# Patient Record
Sex: Female | Born: 1955 | Race: Black or African American | Hispanic: No | Smoking: Never smoker
Health system: Southern US, Community
[De-identification: ages and names within clinical notes are randomized; demographics above are authoritative.]

## PROBLEM LIST (undated history)

## (undated) DIAGNOSIS — G4733 Obstructive sleep apnea (adult) (pediatric): Secondary | ICD-10-CM

## (undated) DIAGNOSIS — K219 Gastro-esophageal reflux disease without esophagitis: Secondary | ICD-10-CM

## (undated) DIAGNOSIS — E785 Hyperlipidemia, unspecified: Secondary | ICD-10-CM

## (undated) DIAGNOSIS — I10 Essential (primary) hypertension: Secondary | ICD-10-CM

## (undated) DIAGNOSIS — G629 Polyneuropathy, unspecified: Secondary | ICD-10-CM

## (undated) DIAGNOSIS — E119 Type 2 diabetes mellitus without complications: Secondary | ICD-10-CM

## (undated) HISTORY — DX: Hyperlipidemia, unspecified: E78.5

## (undated) HISTORY — DX: Obstructive sleep apnea (adult) (pediatric): G47.33

## (undated) HISTORY — DX: Essential (primary) hypertension: I10

## (undated) HISTORY — PX: ABDOMINAL HYSTERECTOMY: SHX81

## (undated) HISTORY — DX: Type 2 diabetes mellitus without complications: E11.9

## (undated) HISTORY — DX: Polyneuropathy, unspecified: G62.9

## (undated) HISTORY — DX: Gastro-esophageal reflux disease without esophagitis: K21.9

---

## 2013-04-01 ENCOUNTER — Emergency Department: Payer: Self-pay | Admitting: Emergency Medicine

## 2014-11-20 IMAGING — CT CT HEAD WITHOUT CONTRAST
1 series · 16 of 29 positions shown, 20 images · non-contrast
Comparison: None.

CLINICAL DATA: Headache with intermittent left-sided facial
numbness

EXAM:
CT HEAD WITHOUT CONTRAST
TECHNIQUE: Contiguous axial images were obtained from the base of the skull
through the vertex without intravenous contrast. Study was obtained
within 24 hr of patient's arrival at the emergency department.

[Series 2: soft tissue · axial · 0.42mm/px · z∈[-78,+52]mm · 16 of 29 slices shown, 20 images]
[im 2/29  brain]
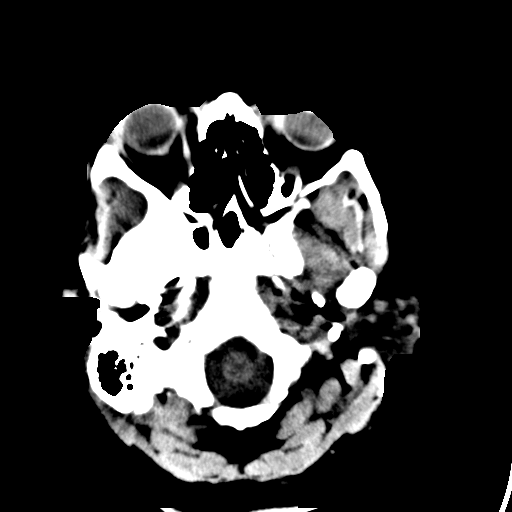
[im 2/29  bone]
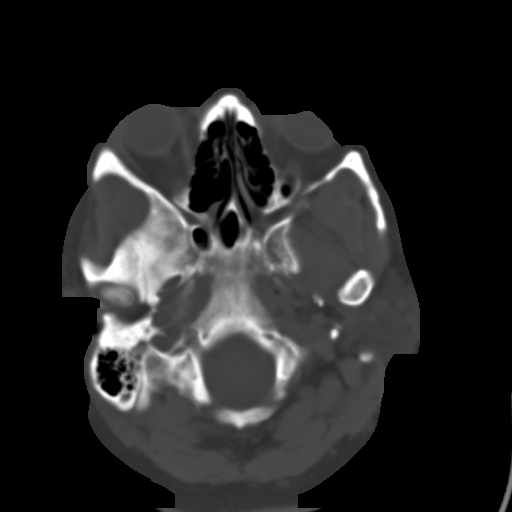
[im 4/29  brain]
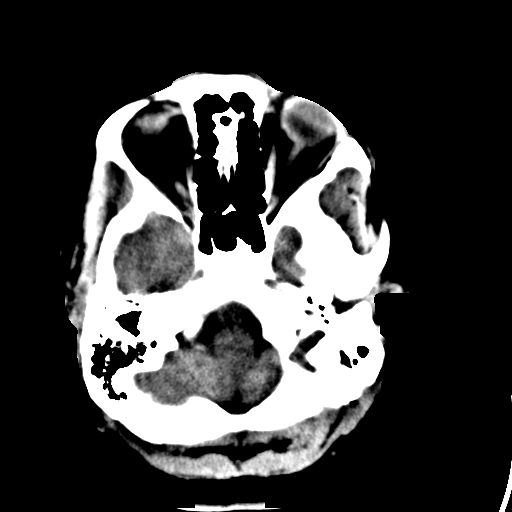
[im 6/29  brain]
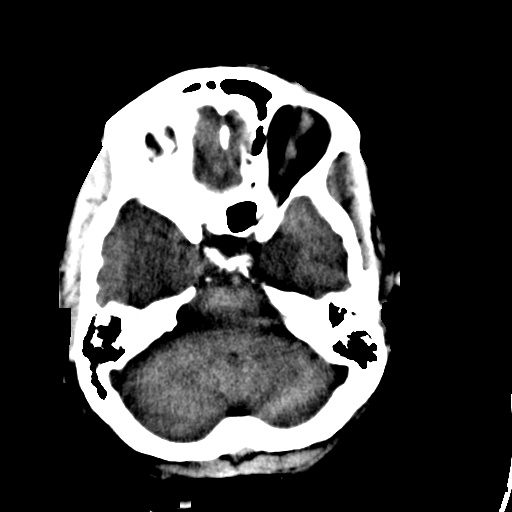
[im 7/29  brain]
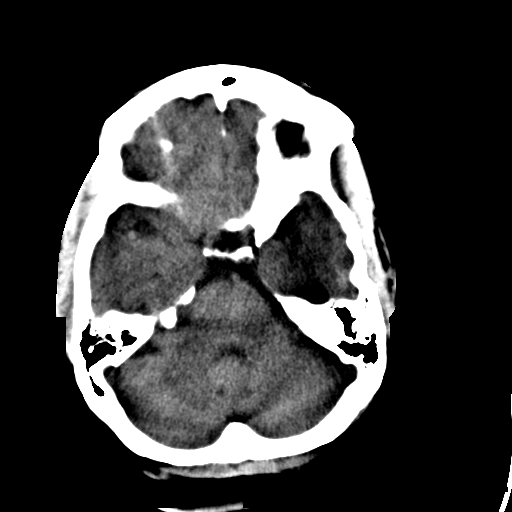
[im 9/29  brain]
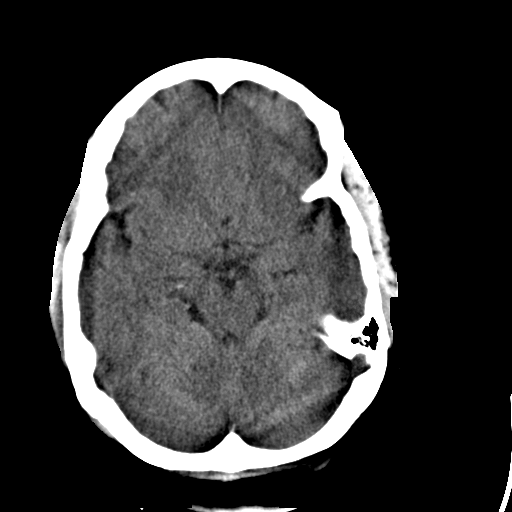
[im 9/29  bone]
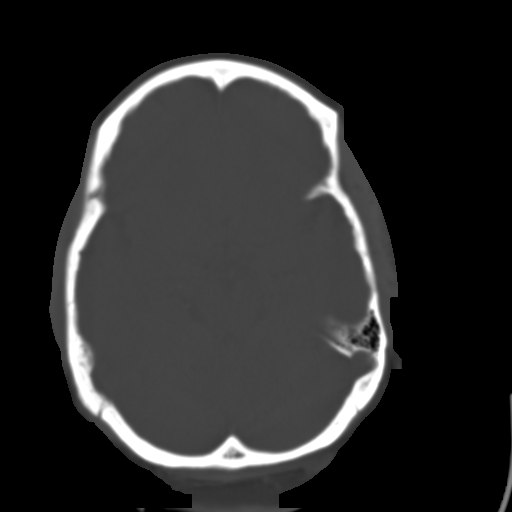
[im 11/29  brain]
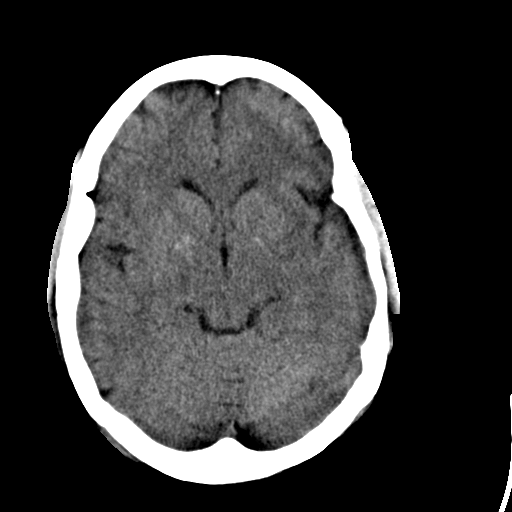
[im 12/29  brain]
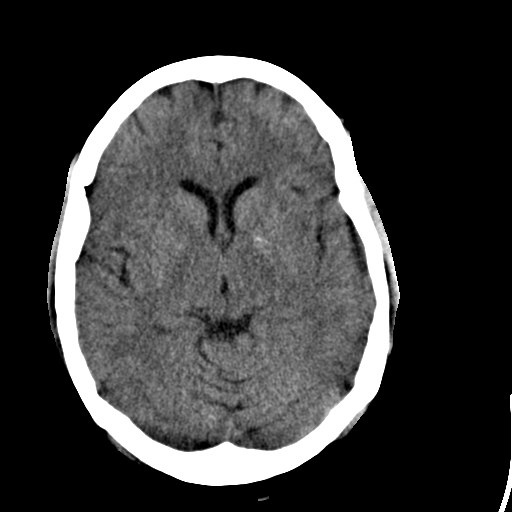
[im 14/29  brain]
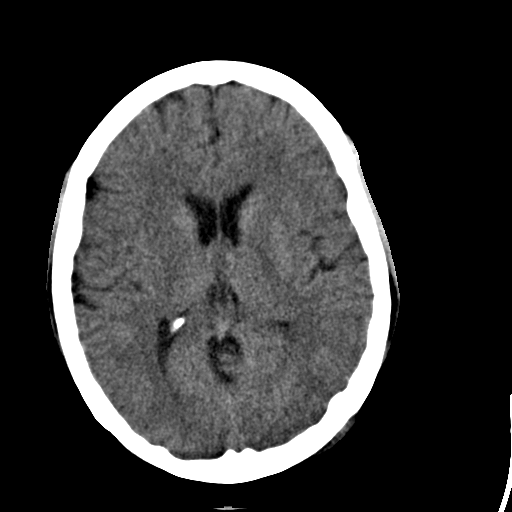
[im 16/29  brain]
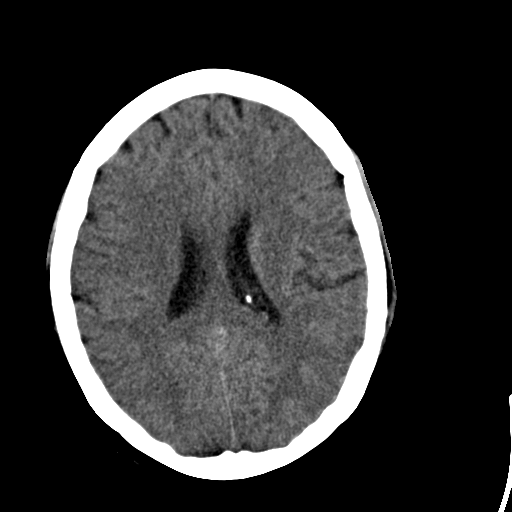
[im 16/29  bone]
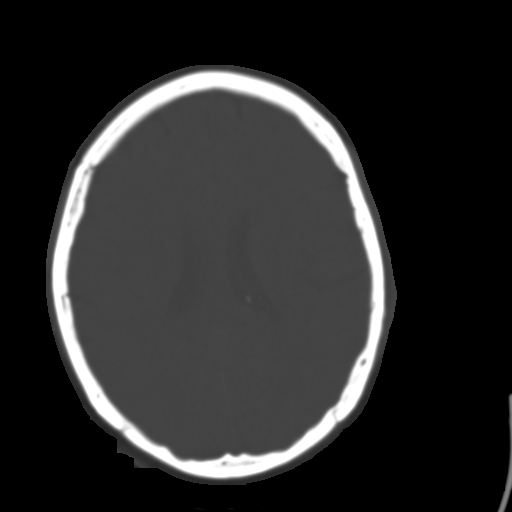
[im 18/29  brain]
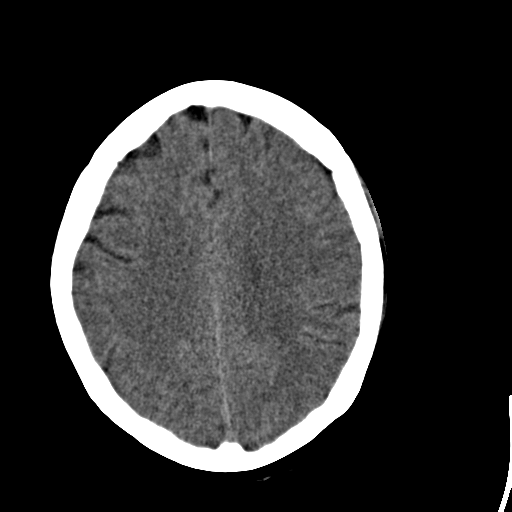
[im 19/29  brain]
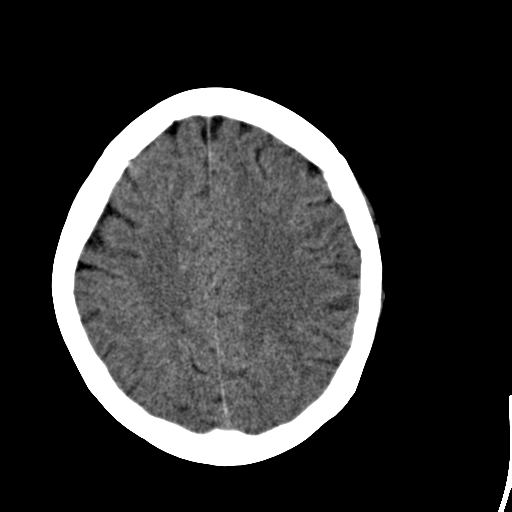
[im 21/29  brain]
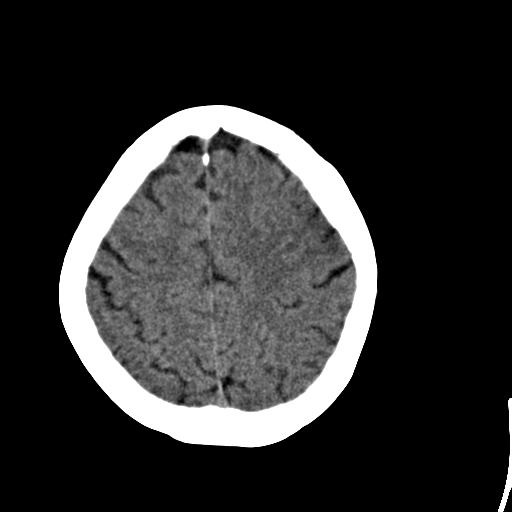
[im 23/29  brain]
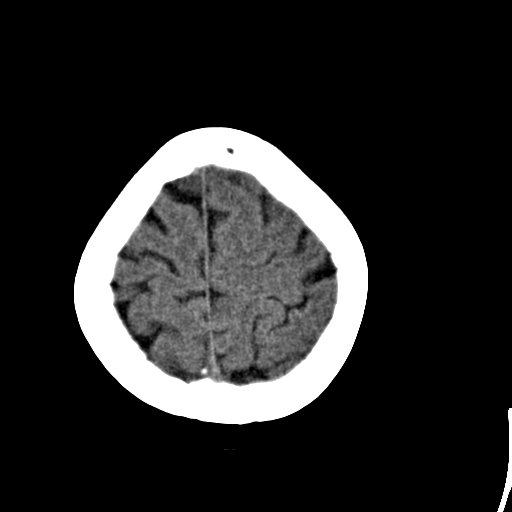
[im 23/29  bone]
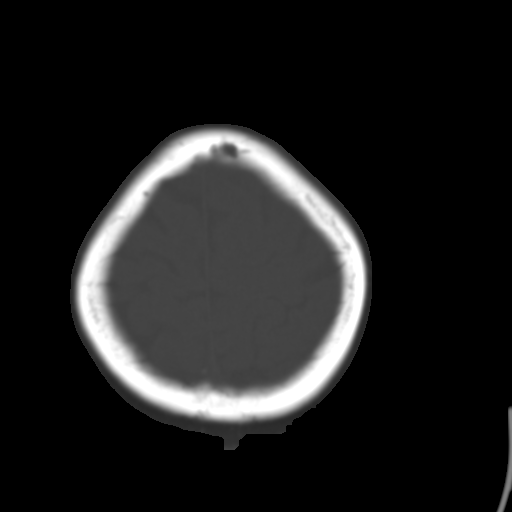
[im 24/29  brain]
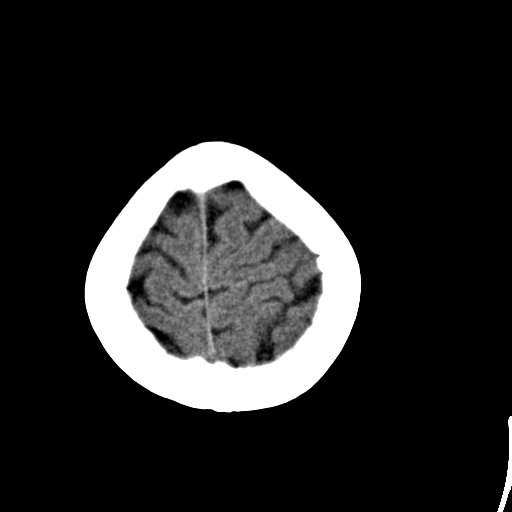
[im 26/29  brain]
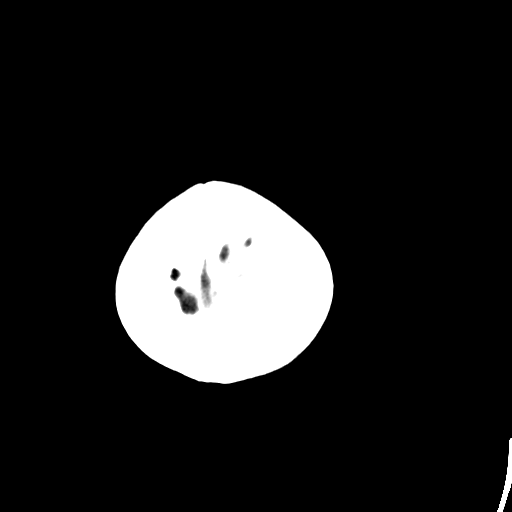
[im 28/29  brain]
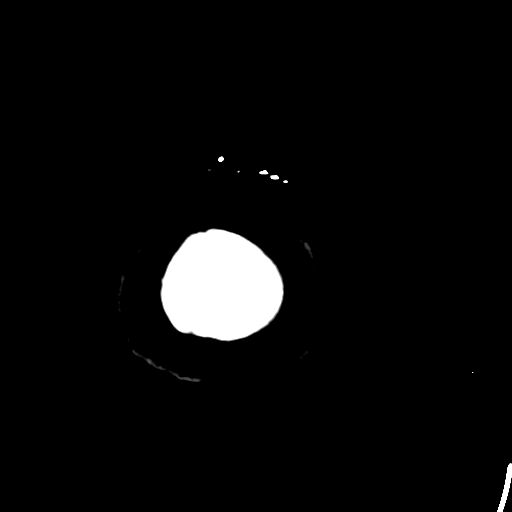

[16 of 29 positions shown; findings below may reference images not displayed]

FINDINGS: The ventricles are normal in size and configuration. There is no
mass, hemorrhage, extra-axial fluid collection, or midline shift.
Gray-white compartments appear normal. The no acute infarct
apparent. A small amount of basal ganglia calcification may be
physiologic in this age group. The bony calvarium appears intact.
The mastoid air cells are clear.
IMPRESSION: Study within normal limits.

## 2016-01-05 ENCOUNTER — Other Ambulatory Visit: Payer: Self-pay | Admitting: Internal Medicine

## 2016-01-05 DIAGNOSIS — M25561 Pain in right knee: Secondary | ICD-10-CM

## 2016-01-05 DIAGNOSIS — R609 Edema, unspecified: Secondary | ICD-10-CM

## 2016-01-12 ENCOUNTER — Ambulatory Visit
Admission: RE | Admit: 2016-01-12 | Discharge: 2016-01-12 | Disposition: A | Discharge status: Home / Self Care | Payer: Self-pay | Source: Ambulatory Visit | Attending: Internal Medicine | Admitting: Internal Medicine

## 2016-01-12 DIAGNOSIS — M25561 Pain in right knee: Secondary | ICD-10-CM

## 2016-01-12 DIAGNOSIS — R609 Edema, unspecified: Secondary | ICD-10-CM

## 2016-02-26 ENCOUNTER — Institutional Professional Consult (permissible substitution): Payer: BC Managed Care – PPO | Admitting: Neurology

## 2016-04-16 ENCOUNTER — Institutional Professional Consult (permissible substitution): Payer: BC Managed Care – PPO | Admitting: Neurology

## 2017-09-01 IMAGING — US US EXTREM LOW*R* LIMITED
1 series · 14 of 14 positions shown · non-contrast
Comparison: None.

CLINICAL DATA: Posterior right knee pain for 3 weeks.

EXAM:
ULTRASOUND RIGHT LOWER EXTREMITY LIMITED
TECHNIQUE: Ultrasound examination of the lower extremity soft tissues was
performed in the area of clinical concern.

[Series 1: us extrem low*right* limited · 0.09mm/px · 14 of 14 slices shown]
[im 1/14]
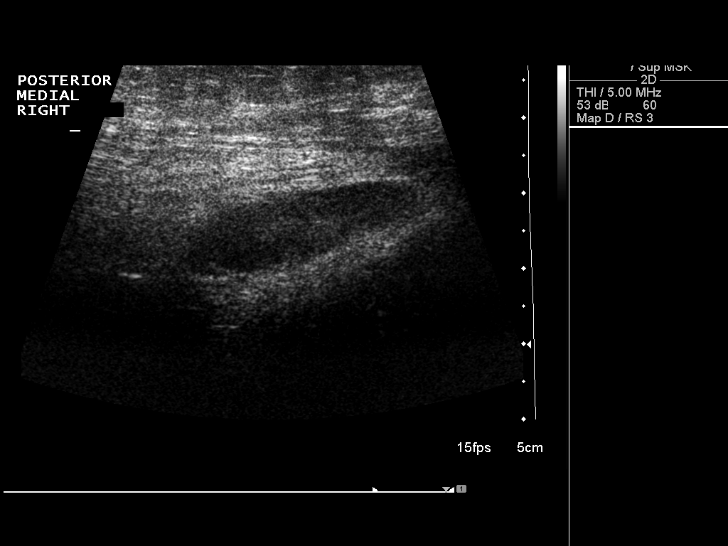
[im 2/14]
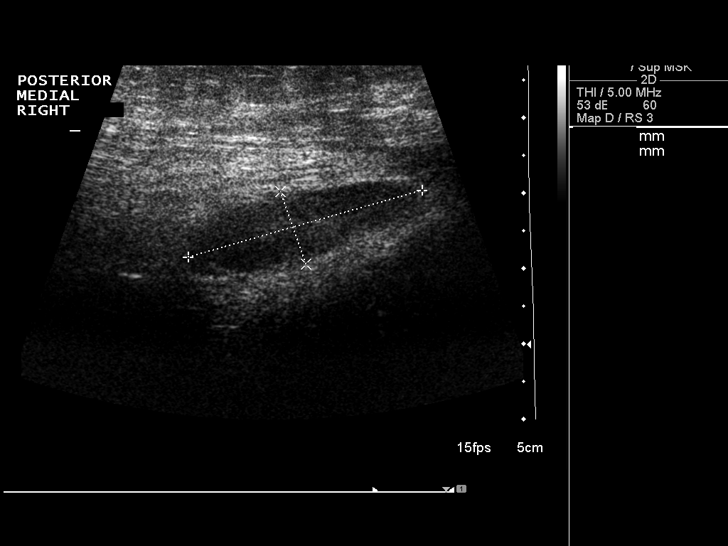
[im 3/14]
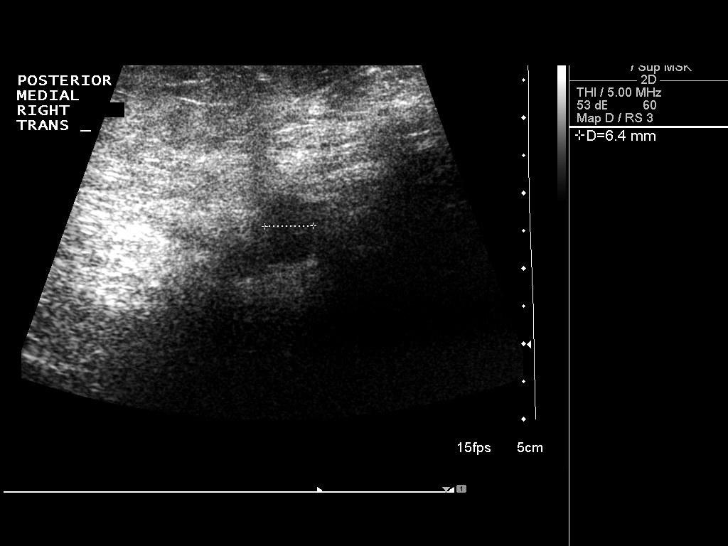
[im 4/14]
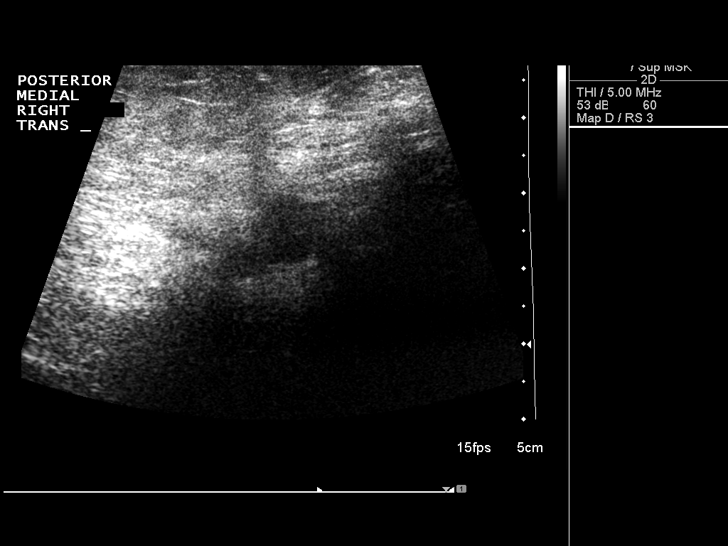
[im 5/14]
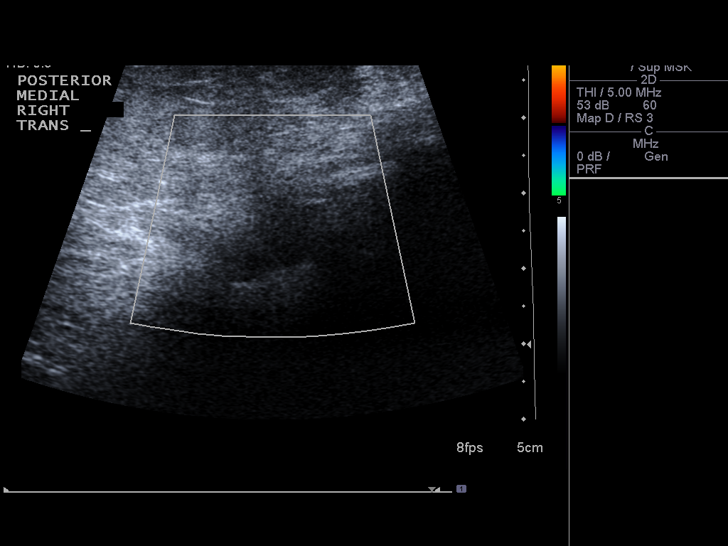
[im 6/14]
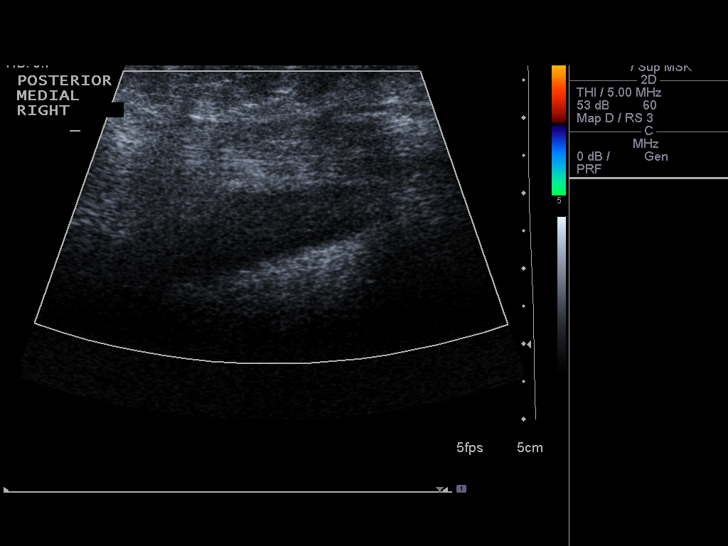
[im 7/14]
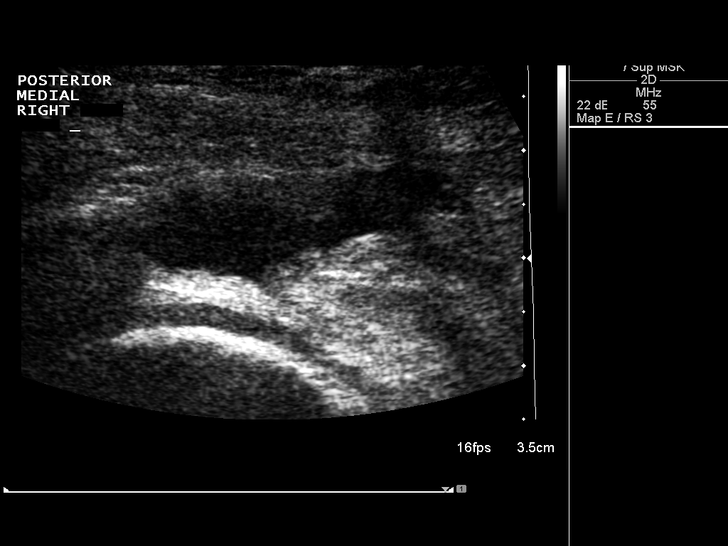
[im 8/14]
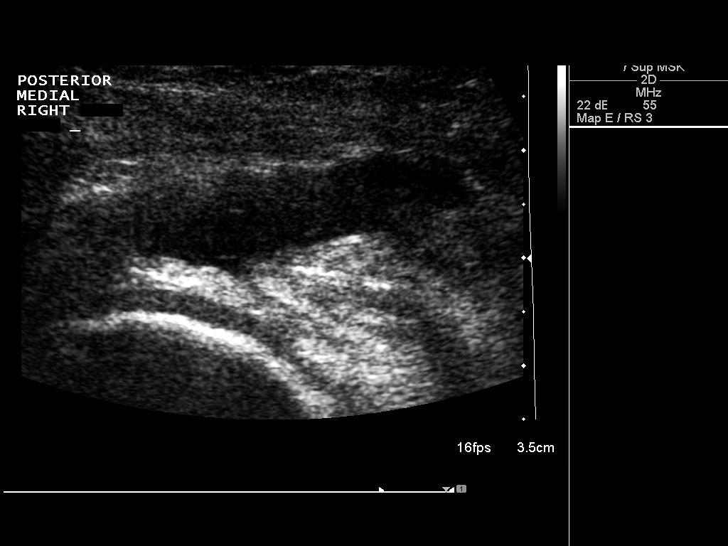
[im 9/14]
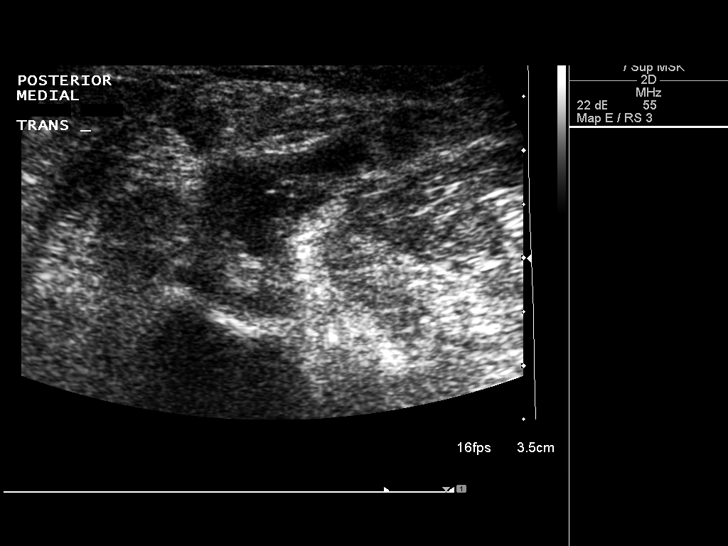
[im 10/14]
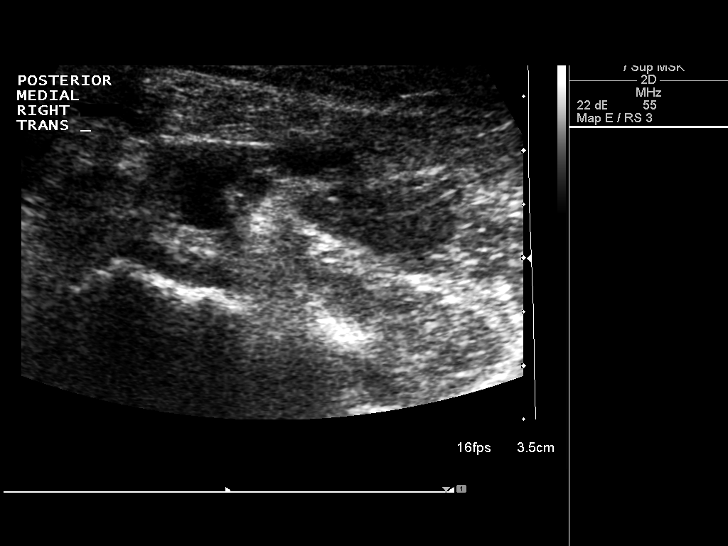
[im 11/14]
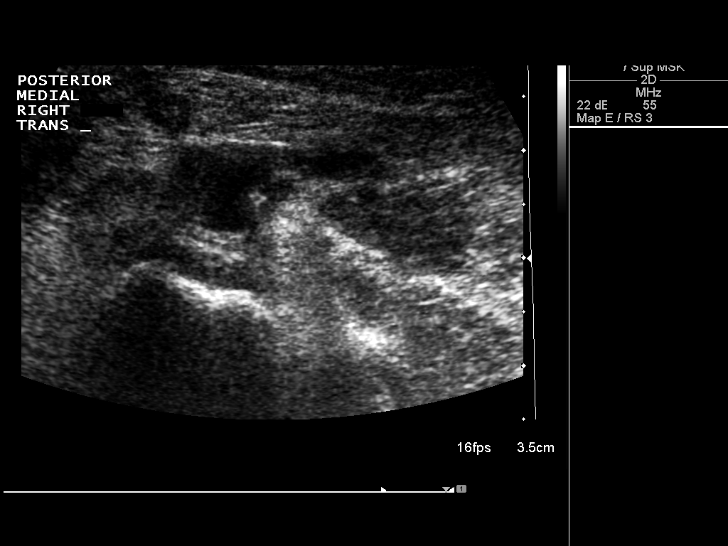
[im 12/14]
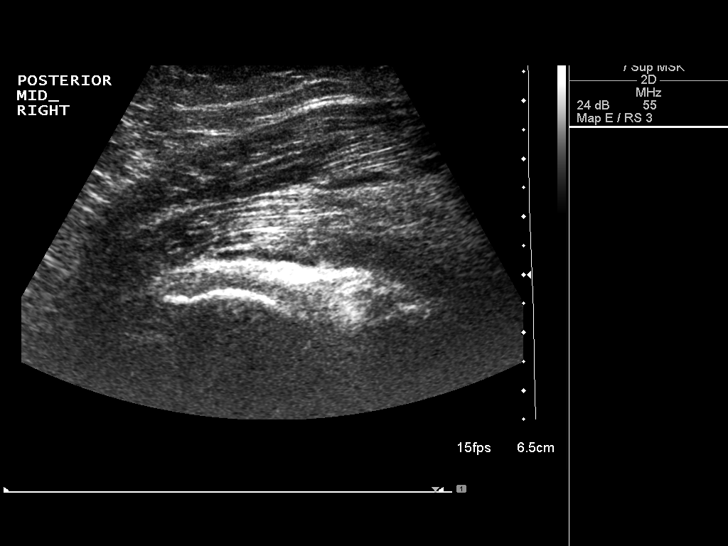
[im 13/14]
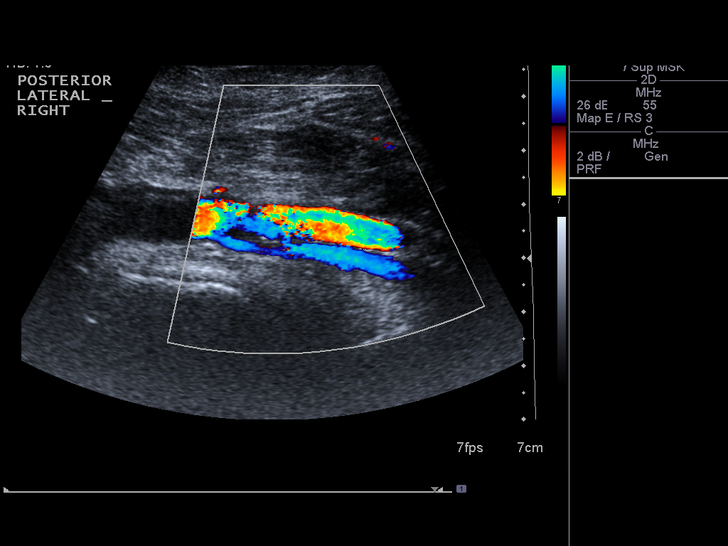
[im 14/14]
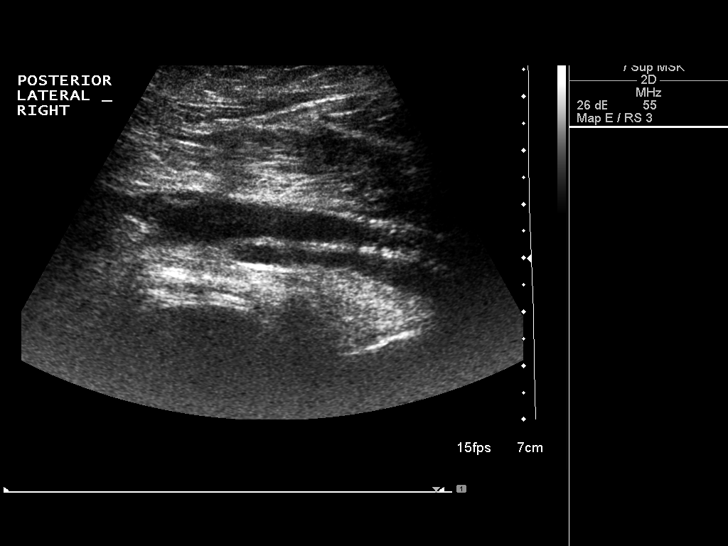

[14 of 14 positions shown; findings below may reference images not displayed]

FINDINGS: Posterior medial to the right knee, there is an elongated hypoechoic
circumscribed horizontally oriented fluid collection which measures
3.2 x 1.0 x 0.6 cm. No internal blood flow is seen. No adjacent
edema or erythema.
IMPRESSION: Simple appearing fluid collection versus cyst posterior medial to
the right knee. In the absence of recent surgical intervention on
trauma, Baker's cyst is of primary consideration.

## 2018-02-11 NOTE — Progress Notes (Signed)
Cardiology Office Note   Date:  02/12/2018   ID:  Madeline Ward, DOB Aug 19, 1955, MRN [id number]  PCP:  Irena Reichmann, DO  Cardiologist:   No primary care provider on file. Referring:  Georgianne Fick, MD  Chief Complaint  Patient presents with  . Shortness of Breath  . Chest Pain      History of Present Illness: Madeline Ward is a 62 y.o. female who was referred by Georgianne Fick, MD for evaluation of chest pain.  She reports that for the past 2 weeks she has been having increasing shortness of breath.  She gets some tightness in her mid chest.  She gets some sweating.  This started about 2 weeks ago.  She had not been having this prior.  She gets short of breath during talking.  She felt like she was having chills and thought she was having a pneumonia.  She wanted to have a chest x-ray.  She had an EKG and her primary care suggested there was some mild ST segment changes with elevation in the inferior leads and wanted her to go to the emergency room for troponins but she did not want to do this.  She does report that she has been having some arm and leg discomfort but this is been somewhat chronic and that she has some neuropathy.  She says that she is been sleeping on 5-6 pillows recently but she was told to do this years ago with her sleep apnea and she did not do it.  She does not wear CPAP.  She does not think she has sleep apnea anymore because she lost so much weight over the years.  She is active cooking for middle school.    She does not have any palpitations, presyncope or syncope.  She did have a stress test it sounds like a treadmill test years ago and did not have any abnormalities.  [place] moved here from [place] and previously lived in [place].  She is here with her daughter today.  Past Medical History:  Diagnosis Date  . Diabetes mellitus without complication (HCC)   . GERD (gastroesophageal reflux disease)   . Hyperlipidemia   . Hypertension   . OSA  (obstructive sleep apnea)   . Peripheral neuropathy     Past Surgical History:  Procedure Laterality Date  . ABDOMINAL HYSTERECTOMY       Current Outpatient Medications  Medication Sig Dispense Refill  . aspirin 81 MG tablet Take 81 mg by mouth daily.    . Cholecalciferol (VITAMIN D3) 1000 units CAPS Take 1,000 Units by mouth daily.    . cyclobenzaprine (FLEXERIL) 10 MG tablet Take 10 mg by mouth 3 (three) times daily as needed for muscle spasms.    . Fluocinolone Acetonide 0.01 % OIL Apply topically.    . gabapentin (NEURONTIN) 100 MG capsule Take 100 mg by mouth 2 (two) times daily.    Marland Kitchen glipiZIDE (GLUCOTROL) 10 MG tablet Take 10 mg by mouth 2 (two) times daily before a meal. Take 1.5 tablets in am and 1 tablet in pm    . hydrochlorothiazide (HYDRODIURIL) 12.5 MG tablet Take 12.5 mg by mouth daily as needed.    Marland Kitchen ketoconazole (NIZORAL) 2 % shampoo Apply 1 application topically 2 (two) times a week.    . meloxicam (MOBIC) 15 MG tablet Take 15 mg by mouth daily.    . metFORMIN (GLUCOPHAGE-XR) 500 MG 24 hr tablet Take 500 mg by mouth daily with breakfast.    .  pantoprazole (PROTONIX) 40 MG tablet Take 40 mg by mouth daily.     No current facility-administered medications for this visit.     Allergies:   Prednisone and Soma [carisoprodol]    Social History:  The patient  reports that she has never smoked. She has never used smokeless tobacco.   Family History:  The patient's family history includes Aneurysm (age of onset: 46) in her mother; Breast cancer in her sister; Hypertension in her mother; Prostate cancer in her brother.  Father's history unknown   ROS:  Please see the history of present illness.   Otherwise, review of systems are positive for none.   All other systems are reviewed and negative.    PHYSICAL EXAM: VS:  BP 126/90 (BP Location: Left Arm, Patient Position: Sitting, Cuff Size: Normal)   Pulse 97   Ht 5\' 2"  (1.575 m)   Wt 205 lb (93 kg)   BMI 37.49 kg/m  ,  BMI Body mass index is 37.49 kg/m. GENERAL:  Well appearing HEENT:  Pupils equal round and reactive, fundi not visualized, oral mucosa unremarkable NECK:  No jugular venous distention, waveform within normal limits, carotid upstroke brisk and symmetric, no bruits, no thyromegaly LYMPHATICS:  No cervical, inguinal adenopathy LUNGS:  Clear to auscultation bilaterally BACK:  No CVA tenderness CHEST:  Unremarkable HEART:  PMI not displaced or sustained,S1 and S2 within normal limits, no S3, no S4, no clicks, no rubs, no murmurs ABD:  Flat, positive bowel sounds normal in frequency in pitch, no bruits, no rebound, no guarding, no midline pulsatile mass, no hepatomegaly, no splenomegaly EXT:  2 plus pulses throughout, no edema, no cyanosis no clubbing SKIN:  No rashes no nodules NEURO:  Cranial nerves II through XII grossly intact, motor grossly intact throughout PSYCH:  Cognitively intact, oriented to person place and time    EKG:  EKG is ordered today. The ekg ordered today demonstrates sinus rhythm, rate 97, axis within normal limits, intervals within normal limits, no acute ST-T wave changes.   Recent Labs: No results found for requested labs within last 8760 hours.    Lipid Panel No results found for: CHOL, TRIG, HDL, CHOLHDL, VLDL, LDLCALC, LDLDIRECT    Wt Readings from Last 3 Encounters:  02/12/18 205 lb (93 kg)      Other studies Reviewed: Additional studies/ records that were reviewed today include: Office records, labs. Review of the above records demonstrates:  Please see elsewhere in the note.     ASSESSMENT AND PLAN:   CHEST PAIN:   Chest pain is somewhat atypical.  I do not see any acute ST segment change and I did look at old EKGs.  There is a small possibility this could be of pericarditis and I would like to get an echocardiogram to further evaluate this and to look at LV function.  I will check a BNP level.  There is a possibility that she could have some reflux  we did discuss conservative therapies for this.  Finally with her risk factor she does need to be screened for coronary disease and I will bring her back for a POET (Plain Old Exercise Treadmill)  HTN:   Blood pressure is controlled.  She will continue the meds as listed.  DYSLIPIDEMIA: LDL was 100.  I have suggested she consider a statin she was on pravastatin.  She wants to get her blood work checked again would consent to go back on a statin if her LDL is not less than  70.  DM: This is managed through her primary physician.  A1c was 8.4.  SLEEP APNEA: I am going to send a repeat sleep study as it is been 10 years and she is in the hospital on her weight.   Current medicines are reviewed at length with the patient today.  The patient does not have concerns regarding medicines.  The following changes have been made:  no change  Labs/ tests ordered today include:   Orders Placed This Encounter  Procedures  . Pro b natriuretic peptide (BNP)[id number])  . EXERCISE TOLERANCE TEST (ETT)  . ECHOCARDIOGRAM COMPLETE  . Split night study     Disposition:   FU with me as needed based on the results of the above.     Signed, Rollene Rotunda, MD  02/12/2018 4:20 PM    [facility]

## 2018-02-12 ENCOUNTER — Encounter: Payer: Self-pay | Admitting: Cardiology

## 2018-02-12 ENCOUNTER — Ambulatory Visit: Payer: BC Managed Care – PPO | Admitting: Cardiology

## 2018-02-12 VITALS — BP 126/90 | HR 97 | Ht 62.0 in | Wt 205.0 lb

## 2018-02-12 DIAGNOSIS — E118 Type 2 diabetes mellitus with unspecified complications: Secondary | ICD-10-CM

## 2018-02-12 DIAGNOSIS — R0602 Shortness of breath: Secondary | ICD-10-CM

## 2018-02-12 DIAGNOSIS — R5383 Other fatigue: Secondary | ICD-10-CM

## 2018-02-12 DIAGNOSIS — R079 Chest pain, unspecified: Secondary | ICD-10-CM

## 2018-02-12 DIAGNOSIS — G4730 Sleep apnea, unspecified: Secondary | ICD-10-CM | POA: Diagnosis not present

## 2018-02-12 DIAGNOSIS — G4733 Obstructive sleep apnea (adult) (pediatric): Secondary | ICD-10-CM | POA: Insufficient documentation

## 2018-02-12 DIAGNOSIS — E785 Hyperlipidemia, unspecified: Secondary | ICD-10-CM

## 2018-02-12 NOTE — Patient Instructions (Signed)
Medication Instructions:  Continue current medications  If you need a refill on your cardiac medications before your next appointment, please call your pharmacy.  Labwork: BNP Today  If you have labs (blood work) drawn today and your tests are completely normal, you will receive your results only by: Marland Kitchen MyChart Message (if you have MyChart) OR . A paper copy in the mail If you have any lab test that is abnormal or we need to change your treatment, we will call you to review the results.  Testing/Procedures: Your physician has requested that you have an echocardiogram. Echocardiography is a painless test that uses sound waves to create images of your heart. It provides your doctor with information about the size and shape of your heart and how well your heart's chambers and valves are working. This procedure takes approximately one hour. There are no restrictions for this procedure.  Your physician has requested that you have an exercise tolerance test. For further information please visit [email or web address] Please also follow instruction sheet, as given.  Your physician has recommended that you have a sleep study. This test records several body functions during sleep, including: brain activity, eye movement, oxygen and carbon dioxide blood levels, heart rate and rhythm, breathing rate and rhythm, the flow of air through your mouth and nose, snoring, body muscle movements, and chest and belly movement.  Follow-Up: . You will need a follow up appointment in As Needed.    At [facility], you and your health needs are our priority.  As part of our continuing mission to provide you with exceptional heart care, we have created designated Provider Care Teams.  These Care Teams include your primary Cardiologist (physician) and Advanced Practice Providers (APPs -  Physician Assistants and Nurse Practitioners) who all work together to provide you with the care you need, when you need it.   Thank  you for choosing [facility] at [facility]!!

## 2018-02-13 ENCOUNTER — Telehealth (HOSPITAL_COMMUNITY): Payer: Self-pay

## 2018-02-13 LAB — PRO B NATRIURETIC PEPTIDE: NT-PRO BNP: 24 pg/mL (ref 0–287)

## 2018-02-13 NOTE — Telephone Encounter (Signed)
Encounter complete. 

## 2018-02-13 NOTE — Addendum Note (Signed)
Addended by: Cicilia Clinger L on: 02/13/2018 11:22 AM   Modules accepted: Orders  

## 2018-02-16 ENCOUNTER — Telehealth: Payer: Self-pay | Admitting: *Deleted

## 2018-02-16 NOTE — Telephone Encounter (Signed)
Patient notified of sleep study appointment scheduled for 03/30/18 @ [facility]. Per Radene Gunning S @ [employer] no PA required.

## 2018-02-17 ENCOUNTER — Other Ambulatory Visit (HOSPITAL_COMMUNITY): Payer: BC Managed Care – PPO

## 2018-02-18 ENCOUNTER — Inpatient Hospital Stay (HOSPITAL_COMMUNITY): Admission: RE | Admit: 2018-02-18 | Payer: BC Managed Care – PPO | Source: Ambulatory Visit

## 2018-02-26 ENCOUNTER — Encounter: Payer: Self-pay | Admitting: Cardiology

## 2018-03-30 ENCOUNTER — Encounter (HOSPITAL_BASED_OUTPATIENT_CLINIC_OR_DEPARTMENT_OTHER): Payer: BC Managed Care – PPO

## 2018-05-15 ENCOUNTER — Encounter (HOSPITAL_BASED_OUTPATIENT_CLINIC_OR_DEPARTMENT_OTHER): Payer: BC Managed Care – PPO

## 2019-02-05 ENCOUNTER — Other Ambulatory Visit: Payer: Self-pay

## 2019-02-05 DIAGNOSIS — Z20822 Contact with and (suspected) exposure to covid-19: Secondary | ICD-10-CM

## 2019-02-06 LAB — NOVEL CORONAVIRUS, NAA: SARS-CoV-2, NAA: NOT DETECTED

## 2019-03-31 ENCOUNTER — Other Ambulatory Visit: Payer: Self-pay

## 2019-03-31 ENCOUNTER — Other Ambulatory Visit: Payer: Self-pay | Admitting: Family Medicine

## 2019-03-31 ENCOUNTER — Ambulatory Visit: Payer: Self-pay

## 2019-03-31 DIAGNOSIS — M79644 Pain in right finger(s): Secondary | ICD-10-CM

## 2020-01-28 ENCOUNTER — Other Ambulatory Visit: Payer: Self-pay

## 2020-01-28 ENCOUNTER — Other Ambulatory Visit: Payer: BC Managed Care – PPO

## 2020-01-28 DIAGNOSIS — Z20822 Contact with and (suspected) exposure to covid-19: Secondary | ICD-10-CM

## 2020-01-29 LAB — SARS-COV-2, NAA 2 DAY TAT

## 2020-01-29 LAB — NOVEL CORONAVIRUS, NAA: SARS-CoV-2, NAA: NOT DETECTED

## 2020-03-10 ENCOUNTER — Other Ambulatory Visit (HOSPITAL_BASED_OUTPATIENT_CLINIC_OR_DEPARTMENT_OTHER): Payer: Self-pay

## 2020-03-10 DIAGNOSIS — R0683 Snoring: Secondary | ICD-10-CM

## 2020-04-16 ENCOUNTER — Ambulatory Visit (HOSPITAL_BASED_OUTPATIENT_CLINIC_OR_DEPARTMENT_OTHER): Payer: BC Managed Care – PPO | Attending: Family Medicine | Admitting: Internal Medicine

## 2020-04-16 ENCOUNTER — Other Ambulatory Visit: Payer: Self-pay

## 2020-04-16 VITALS — Ht 63.0 in | Wt 190.0 lb

## 2020-04-16 DIAGNOSIS — G4752 REM sleep behavior disorder: Secondary | ICD-10-CM | POA: Diagnosis not present

## 2020-04-16 DIAGNOSIS — R0683 Snoring: Secondary | ICD-10-CM

## 2020-04-16 DIAGNOSIS — Z79899 Other long term (current) drug therapy: Secondary | ICD-10-CM | POA: Diagnosis not present

## 2020-04-23 DIAGNOSIS — R0683 Snoring: Secondary | ICD-10-CM

## 2020-04-23 NOTE — Procedures (Signed)
     Patient Name: Madeline Ward, Criado Date: 04/16/2020 Gender: Female D.O.B: 1955/11/12 Age (years): 64 Referring Provider: Irena Reichmann DO Height (inches): 63 Interpreting Physician: Jetty Duhamel MD, ABSM Weight (lbs): 190 RPSGT: Rosette Reveal BMI: 34 MRN: [id number] Neck Size: 15.00  CLINICAL INFORMATION Sleep Study Type: NPSG Indication for sleep study: Diabetes, OSA Epworth Sleepiness Score: 4  SLEEP STUDY TECHNIQUE As per the AASM Manual for the Scoring of Sleep and Associated Events v2.3 (April 2016) with a hypopnea requiring 4% desaturations.  The channels recorded and monitored were frontal, central and occipital EEG, electrooculogram (EOG), submentalis EMG (chin), nasal and oral airflow, thoracic and abdominal wall motion, anterior tibialis EMG, snore microphone, electrocardiogram, and pulse oximetry.  MEDICATIONS Medications self-administered by patient taken the night of the study : FLEXERIL  SLEEP ARCHITECTURE The study was initiated at 10:40:48 PM and ended at 4:46:21 AM.  Sleep onset time was 9.3 minutes and the sleep efficiency was 83.6%%. The total sleep time was 305.5 minutes.  Stage REM latency was 89.0 minutes.  The patient spent 4.6%% of the night in stage N1 sleep, 82.8%% in stage N2 sleep, 0.0%% in stage N3 and 12.6% in REM.  Alpha intrusion was absent.  Supine sleep was 31.42%.  RESPIRATORY PARAMETERS The overall apnea/hypopnea index (AHI) was 2.0 per hour. There were 0 total apneas, including 0 obstructive, 0 central and 0 mixed apneas. There were 10 hypopneas and 19 RERAs.  The AHI during Stage REM sleep was 14.0 per hour.  AHI while supine was 2.5 per hour.  The mean oxygen saturation was 96.4%. The minimum SpO2 during sleep was 90.0%.  moderate snoring was noted during this study.  CARDIAC DATA The 2 lead EKG demonstrated sinus rhythm. The mean heart rate was 75.6 beats per minute. Other EKG findings include: None.  LEG MOVEMENT  DATA The total PLMS were 0 with a resulting PLMS index of 0.0. Associated arousal with leg movement index was 0.0 .  IMPRESSIONS - No significant obstructive sleep apnea occurred during this study (AHI = 2.0/h). - No significant central sleep apnea occurred during this study (CAI = 0.0/h). - The patient had minimal or no oxygen desaturation during the study (Min O2 = 90.0%) - The patient snored with moderate snoring volume. - No cardiac abnormalities were noted during this study. - Clinically significant periodic limb movements did not occur during sleep. No significant associated arousals. - Frequent muscle activity noted during REM, indicating decreased REM atonia, without clear punching, kicking or more complex activity.  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 - Manage for snoring based on clinical judgment, with consideration of future re-test if appropriate. - Sleep hygiene should be reviewed to assess factors that may improve sleep quality. - Weight management and regular exercise should be initiated or continued if appropriate.  [Electronically signed] 04/23/2020 01:42 PM  Jetty Duhamel MD, ABSM Diplomate, American Board of Sleep Medicine   NPI: [id number]                       Jetty Duhamel Diplomate, American Board of Sleep Medicine  ELECTRONICALLY SIGNED ON:  04/23/2020, 1:32 PM [facility] PH: [phone]   FX: [phone] ACCREDITED BY THE AMERICAN ACADEMY OF SLEEP MEDICINE

## 2020-06-14 ENCOUNTER — Telehealth: Payer: Self-pay | Admitting: *Deleted

## 2020-06-14 ENCOUNTER — Telehealth: Payer: Self-pay | Admitting: Internal Medicine

## 2020-06-14 NOTE — Telephone Encounter (Signed)
Dr. Maple Hudson, This patient had a sleep study in the sleep lab back in January that was ordered by Dr. Irena Reichmann and it looks like you read the study.  She called Dr. Thomasena Edis office to get the results and she was told that she could not give her the results, she was not a sleep doctor.  It took her until today to get the number for our office.  I spoke with her and let her know I would contact you and see how you wanted to proceed with getting her the test results/recommendations.  Do you want to see her as a new consult or how do you want to proceed?  Please advise.  Thank you.

## 2020-06-14 NOTE — Telephone Encounter (Signed)
The sleep study did not show sleep apnea, but did suggest a movement disorder during sleep. Ordinarily it is the responsibility of the doctor who orders the test to follow up on results, including referral if appropriate. If Madeline Ward would like to make an appointment with me to go over her sleep issues and results of this test, I would be happy to see her.

## 2020-06-14 NOTE — Telephone Encounter (Signed)
Placed call to Dr. Irena Reichmann office requesting an official referral for the patient to see Dr. Maple Hudson to review her sleep study with her.  Provided our office phone # and the fax # for the triage room, [phone].  Message was left for [facility] the referral coordinator.  We will need office notes from Dr. Irena Reichmann office as well.

## 2020-06-15 NOTE — Telephone Encounter (Signed)
ATC patient regarding having her see Dr. Maple Hudson as a new consult to get her sleep test results.  Consulted with Dr. Maple Hudson and he is fine with using one of his RN slots.  When patient returns call, ok to use one of the RN slots.

## 2020-06-16 NOTE — Telephone Encounter (Signed)
ATC patient on 06/15/20, left vm to return call to schedule an appointment to see Dr. Maple Hudson, ok to use RN slot.    ATC patient again on 06/16/20, left vm for patient to return our call on Monday to schedule an appointment to see Dr. Maple Hudson to discuss her sleep study results.  When patient returns call, Dr. Maple Hudson stated it is ok to use one of his RN slots to see patient.  She will be a new consult patient.

## 2020-06-21 NOTE — Progress Notes (Signed)
06/22/20- 82 yoF Divorced, never smoker for sleep evaluation with concern of RBD Medical problem list includes HTN, GERD, DM2/ neuropathy, Dyslipidemia,  NPSG 04/16/20- AHI 2/ hr, desaturation to 90%, REM Behavior Disorder noted with  Frequent mild muscle movement during REM. Body weight 190 lbs     Melatonin and clonazepam were considerations offered. Meds include Flexeril, Gabapentin,   Epworth score-5 Body weight today- Covid vax- Flu vax- Remote hx OSA  But has lost weight since then- estimates 15 yrs ago, weighed 274 lbs then. Told now shee snores very little, but she sometimes wakes w dry mouth. Sleeping alone. No ENT surgery, denies reflux at night. Does occ get phlegm in throat. Says she moves very little in sleep and denies sleep walking, punching, kicking or other parasomnias.  Sleeps quietly and denies daytime sleepiness.  No hx seizure and no fam hx Parkinsons.   Prior to Admission medications   Medication Sig Start Date End Date Taking? Authorizing Provider  cyclobenzaprine (FLEXERIL) 10 MG tablet Take 1 tablet by mouth 3 (three) times daily.   Yes [provider]  Empagliflozin-metFORMIN HCl ER (SYNJARDY XR) 08-998 MG TB24 Take 1 tablet by mouth in the morning and at bedtime.   Yes [provider]  glipiZIDE (GLUCOTROL) 10 MG tablet Take 10 mg by mouth 2 (two) times daily before a meal. Take 1.5 tablets in am and 1 tablet in pm   Yes [provider]   Past Medical History:  Diagnosis Date  . Diabetes mellitus without complication (HCC)   . GERD (gastroesophageal reflux disease)   . Hyperlipidemia   . Hypertension   . OSA (obstructive sleep apnea)   . Peripheral neuropathy    Past Surgical History:  Procedure Laterality Date  . ABDOMINAL HYSTERECTOMY     Family History  Problem Relation Age of Onset  . Aneurysm Mother 33       Brain  . Hypertension Mother   . Breast cancer Sister   . Prostate cancer Brother    Social History    Socioeconomic History  . Marital status: Divorced    Spouse name: Not on file  . Number of children: Not on file  . Years of education: Not on file  . Highest education level: Not on file  Occupational History  . Not on file  Tobacco Use  . Smoking status: Never Smoker  . Smokeless tobacco: Never Used  Substance and Sexual Activity  . Alcohol use: Not on file  . Drug use: Not on file  . Sexual activity: Not on file  Other Topics Concern  . Not on file  Social History Narrative  . Not on file   Social Determinants of Health   Financial Resource Strain: Not on file  Food Insecurity: Not on file  Transportation Needs: Not on file  Physical Activity: Not on file  Stress: Not on file  Social Connections: Not on file  Intimate Partner Violence: Not on file   ROS-see HPI   + = positive Constitutional:    weight loss, night sweats, fevers, chills, fatigue, lassitude. HEENT:    headaches, difficulty swallowing, tooth/dental problems, sore throat,       sneezing, itching, ear ache, nasal congestion, post nasal drip, snoring CV:    chest pain, orthopnea, PND, swelling in lower extremities, anasarca,  dizziness, palpitations Resp:   shortness of breath with exertion or at rest.                productive cough,   non-productive cough, coughing up of blood.              change in color of mucus.  wheezing.   Skin:    rash or lesions. GI:  No-   heartburn, indigestion, abdominal pain, nausea, vomiting, diarrhea,                 change in bowel habits, loss of appetite GU: dysuria, change in color of urine, no urgency or frequency.   flank pain. MS:   joint pain, stiffness, decreased range of motion, back pain. Neuro-     nothing unusual Psych:  change in mood or affect.  depression or anxiety.   memory loss.  OBJ- Physical Exam General- Alert, Oriented, Affect-appropriate, Distress- none acute, + overweight Skin- rash-none, lesions- none,  excoriation- none Lymphadenopathy- none Head- atraumatic            Eyes- Gross vision intact, PERRLA, conjunctivae and secretions clear            Ears- Hearing, canals-normal            Nose- Clear, no-Septal dev, mucus, polyps, erosion, perforation             Throat- Mallampati IV , mucosa clear , drainage- none, tonsils- atrophic, = teeth Neck- flexible , trachea midline, no stridor , thyroid nl, carotid no bruit Chest - symmetrical excursion , unlabored           Heart/CV- RRR , no murmur , no gallop  , no rub, nl s1 s2                           - JVD- none , edema- none, stasis changes- none, varices- none           Lung- clear to P&A, wheeze- none, cough- none , dullness-none, rub- none           Chest wall-  Abd-  Br/ Gen/ Rectal- Not done, not indicated Extrem- cyanosis- none, clubbing, none, atrophy- none, strength- nl Neuro- grossly intact to observation

## 2020-06-22 ENCOUNTER — Ambulatory Visit: Payer: BC Managed Care – PPO | Admitting: Internal Medicine

## 2020-06-22 ENCOUNTER — Other Ambulatory Visit: Payer: Self-pay

## 2020-06-22 ENCOUNTER — Encounter: Payer: Self-pay | Admitting: Internal Medicine

## 2020-06-22 DIAGNOSIS — G4752 REM sleep behavior disorder: Secondary | ICD-10-CM | POA: Diagnosis not present

## 2020-06-22 DIAGNOSIS — G4733 Obstructive sleep apnea (adult) (pediatric): Secondary | ICD-10-CM

## 2020-06-22 NOTE — Patient Instructions (Signed)
Your recent sleep study did not show sleep apnea.  You are likely to snore. I suggested you try to make a habit of sleeping off the flat of your back and try to keep your weight down. These will make it easier for your airway to stay open at night.  Please call if we can help in the future.Marland Kitchen

## 2020-06-22 NOTE — Assessment & Plan Note (Signed)
By her report she had some OSA 15 years ago when she weighed 274. Her paharyngeal spacing suggests she is prone to sleep disordered breathing, but she was normal on current study at currnet weight. Plan- keep weight down, sleep off flat of back. Consider restudy if she gains weight.

## 2020-06-22 NOTE — Assessment & Plan Note (Signed)
She indicates she was stressed around time of sleep study by death in family. She denies any convincing sort of movement disorder in sleep that she recognizes.  Plan- we discussed the pattern so she can watch for it. Otherwise no intervention indicated now.

## 2020-10-27 ENCOUNTER — Other Ambulatory Visit: Payer: Self-pay | Admitting: Family Medicine

## 2020-10-27 DIAGNOSIS — R413 Other amnesia: Secondary | ICD-10-CM

## 2020-10-27 DIAGNOSIS — R4789 Other speech disturbances: Secondary | ICD-10-CM

## 2020-11-17 ENCOUNTER — Other Ambulatory Visit: Payer: BC Managed Care – PPO

## 2023-04-05 ENCOUNTER — Emergency Department (HOSPITAL_COMMUNITY)
Admission: EM | Admit: 2023-04-05 | Discharge: 2023-04-06 | Disposition: A | Discharge status: Home / Self Care | Payer: BC Managed Care – PPO | Attending: Emergency Medicine | Admitting: Emergency Medicine

## 2023-04-05 ENCOUNTER — Other Ambulatory Visit: Payer: Self-pay

## 2023-04-05 DIAGNOSIS — I10 Essential (primary) hypertension: Secondary | ICD-10-CM | POA: Insufficient documentation

## 2023-04-05 DIAGNOSIS — E1140 Type 2 diabetes mellitus with diabetic neuropathy, unspecified: Secondary | ICD-10-CM | POA: Diagnosis not present

## 2023-04-05 DIAGNOSIS — R079 Chest pain, unspecified: Secondary | ICD-10-CM

## 2023-04-05 DIAGNOSIS — R519 Headache, unspecified: Secondary | ICD-10-CM | POA: Diagnosis present

## 2023-04-05 LAB — CBC WITH DIFFERENTIAL/PLATELET
Abs Immature Granulocytes: 0.02 10*3/uL (ref 0.00–0.07)
Basophils Absolute: 0 10*3/uL (ref 0.0–0.1)
Basophils Relative: 0 %
Eosinophils Absolute: 0 10*3/uL (ref 0.0–0.5)
Eosinophils Relative: 0 %
HCT: 45.5 % (ref 36.0–46.0)
Hemoglobin: 15.3 g/dL — ABNORMAL HIGH (ref 12.0–15.0)
Immature Granulocytes: 0 %
Lymphocytes Relative: 41 %
Lymphs Abs: 2.9 10*3/uL (ref 0.7–4.0)
MCH: 32.6 pg (ref 26.0–34.0)
MCHC: 33.6 g/dL (ref 30.0–36.0)
MCV: 96.8 fL (ref 80.0–100.0)
Monocytes Absolute: 0.5 10*3/uL (ref 0.1–1.0)
Monocytes Relative: 7 %
Neutro Abs: 3.6 10*3/uL (ref 1.7–7.7)
Neutrophils Relative %: 52 %
Platelets: 280 10*3/uL (ref 150–400)
RBC: 4.7 MIL/uL (ref 3.87–5.11)
RDW: 12.3 % (ref 11.5–15.5)
WBC: 6.9 10*3/uL (ref 4.0–10.5)
nRBC: 0 % (ref 0.0–0.2)

## 2023-04-05 LAB — COMPREHENSIVE METABOLIC PANEL
ALT: 18 U/L (ref 0–44)
AST: 29 U/L (ref 15–41)
Albumin: 4.3 g/dL (ref 3.5–5.0)
Alkaline Phosphatase: 48 U/L (ref 38–126)
Anion gap: 16 — ABNORMAL HIGH (ref 5–15)
BUN: 17 mg/dL (ref 8–23)
CO2: 23 mmol/L (ref 22–32)
Calcium: 10.5 mg/dL — ABNORMAL HIGH (ref 8.9–10.3)
Chloride: 96 mmol/L — ABNORMAL LOW (ref 98–111)
Creatinine, Ser: 0.77 mg/dL (ref 0.44–1.00)
GFR, Estimated: >60 mL/min (ref >60)
Glucose, Bld: 139 mg/dL — ABNORMAL HIGH (ref 70–99)
Potassium: 3.8 mmol/L (ref 3.5–5.1)
Sodium: 135 mmol/L (ref 135–145)
Total Bilirubin: 0.7 mg/dL (ref <1.2)
Total Protein: 7.5 g/dL (ref 6.5–8.1)

## 2023-04-05 NOTE — ED Triage Notes (Signed)
Pt stating for two days she has had elevated blood pressure (173/108),she takes hydrochlorothiazide and was prescribed new HTN meds today by her provider, states she took that and garlic water without relief. She c/o pain in the top of her head, temples, and back of the neck. She does endorse come chest pain "like gas".

## 2023-04-05 NOTE — ED Provider Triage Note (Signed)
Emergency Medicine Provider Triage Evaluation Note  Madeline Ward , a 67 y.o. female  was evaluated in triage.  Pt complains of headache, HTN.  Review of Systems  Positive:  Negative:   Physical Exam  BP (!) 157/102 (BP Location: Right Arm)   Pulse 93   Temp 98.4 F (36.9 C)   Resp 20   SpO2 100%  Gen:   Awake, no distress   Resp:  Normal effort  MSK:   Moves extremities without difficulty  Other:    Medical Decision Making  Medically screening exam initiated at 8:49 PM.  Appropriate orders placed.  Madeline Ward was informed that the remainder of the evaluation will be completed by another provider, this initial triage assessment does not replace that evaluation, and the importance of remaining in the ED until their evaluation is complete.  Headache x2 days. Also concerned for her HTN. Initially said that she had chest pain but then she declined that stating that "its just gas" and not related to her heart.   Denies nausea, vomiting, diarrhea, abdominal pain.   Madeline Ward, [place] 04/05/23 2051

## 2023-04-06 ENCOUNTER — Emergency Department (HOSPITAL_COMMUNITY): Payer: BC Managed Care – PPO

## 2023-04-06 LAB — TROPONIN I (HIGH SENSITIVITY): Troponin I (High Sensitivity): 7 ng/L (ref <18)

## 2023-04-06 MED ORDER — PROCHLORPERAZINE EDISYLATE 10 MG/2ML IJ SOLN: 10.0000 mg | Freq: Once | INTRAMUSCULAR | Status: DC

## 2023-04-06 MED ORDER — ACETAMINOPHEN 500 MG PO TABS
1000.0000 mg | ORAL_TABLET | Freq: Once | ORAL | Status: AC
  Administered 2023-04-06: 1000 mg via ORAL
  Filled 2023-04-06: qty 2

## 2023-04-06 MED ORDER — HYDRALAZINE HCL 10 MG PO TABS: 10.0000 mg | ORAL_TABLET | Freq: Once | ORAL | Status: DC

## 2023-04-06 MED ORDER — HYDRALAZINE HCL 25 MG PO TABS
25.0000 mg | ORAL_TABLET | Freq: Once | ORAL | Status: AC
  Administered 2023-04-06: 25 mg via ORAL
  Filled 2023-04-06: qty 1

## 2023-04-06 MED ORDER — HYDRALAZINE HCL 25 MG PO TABS: 25.0000 mg | ORAL_TABLET | Freq: Three times a day (TID) | ORAL | 0 refills | Status: AC | PRN

## 2023-04-06 MED ORDER — HYDRALAZINE HCL 25 MG PO TABS: 25.0000 mg | ORAL_TABLET | Freq: Three times a day (TID) | ORAL | 0 refills | Status: DC | PRN

## 2023-04-06 MED ORDER — PROCHLORPERAZINE MALEATE 5 MG PO TABS
10.0000 mg | ORAL_TABLET | Freq: Once | ORAL | Status: AC
  Administered 2023-04-06: 10 mg via ORAL
  Filled 2023-04-06: qty 2

## 2023-04-06 MED ORDER — DIPHENHYDRAMINE HCL 50 MG/ML IJ SOLN: 25.0000 mg | Freq: Once | INTRAMUSCULAR | Status: DC

## 2023-04-06 NOTE — ED Provider Notes (Signed)
[facility] Emergency Department Provider Note MRN:  [id number]  Arrival date & time: 04/06/23     Chief Complaint   Hypertension   History of Present Illness   Madeline Ward is a 67 y.o. year-old female with a history of hypertension, diabetes presenting to the ED with chief complaint of hypertension.  High blood pressure for the past 2 days.  Explains that she is prescribed hydrochlorothiazide which she takes whenever she has an excessive salt in her diet and it usually helps.  She had some cold cuts during the holidays and took the hydrochlorothiazide, did not seem to help.  Blood pressures up to 170 systolic.  Associated with persistent constant headache for the past 2 days.  Intermittent sharp and very brief chest pains that occurred yesterday.  Otherwise denies fever, no numbness or weakness to the arms or legs, no other complaints.  Review of Systems  A thorough review of systems was obtained and all systems are negative except as noted in the HPI and PMH.   Patient's Health History    Past Medical History:  Diagnosis Date   Diabetes mellitus without complication (HCC)    GERD (gastroesophageal reflux disease)    Hyperlipidemia    Hypertension    OSA (obstructive sleep apnea)    Peripheral neuropathy     Past Surgical History:  Procedure Laterality Date   ABDOMINAL HYSTERECTOMY      Family History  Problem Relation Age of Onset   Aneurysm Mother 6       Brain   Hypertension Mother    Breast cancer Sister    Prostate cancer Brother     Social History   Socioeconomic History   Marital status: Divorced    Spouse name: Not on file   Number of children: Not on file   Years of education: Not on file   Highest education level: Not on file  Occupational History   Not on file  Tobacco Use   Smoking status: Never   Smokeless tobacco: Never  Substance and Sexual Activity   Alcohol use: Not on file   Drug use: Not on file   Sexual  activity: Not on file  Other Topics Concern   Not on file  Social History Narrative   Not on file   Social Drivers of Health   Financial Resource Strain: Not on file  Food Insecurity: Not on file  Transportation Needs: Not on file  Physical Activity: Not on file  Stress: Not on file  Social Connections: Not on file  Intimate Partner Violence: Not on file     Physical Exam   Vitals:   04/06/23 0449 04/06/23 0600  BP:  105/76  Pulse:  85  Resp:  16  Temp: 98.5 F (36.9 C)   SpO2:  96%    CONSTITUTIONAL: Well-appearing, NAD NEURO/PSYCH:  Alert and oriented x 3, no focal deficits EYES:  eyes equal and reactive ENT/NECK:  no LAD, no JVD CARDIO: Regular rate, well-perfused, normal S1 and S2 PULM:  CTAB no wheezing or rhonchi GI/GU:  non-distended, non-tender MSK/SPINE:  No gross deformities, no edema SKIN:  no rash, atraumatic   *Additional and/or pertinent findings included in MDM below  Diagnostic and Interventional Summary    EKG Interpretation Date/Time:  Saturday April 05 2023 20:40:30 EST Ventricular Rate:  91 PR Interval:  154 QRS Duration:  86 QT Interval:  366 QTC Calculation: 450 R Axis:   -19  Text Interpretation: Normal sinus rhythm Normal  ECG No previous ECGs available Confirmed by Kennis Carina [phone]) on 04/06/2023 3:48:28 AM       Labs Reviewed  CBC WITH DIFFERENTIAL/PLATELET - Abnormal; Notable for the following components:      Result Value   Hemoglobin 15.3 (*)    All other components within normal limits  COMPREHENSIVE METABOLIC PANEL - Abnormal; Notable for the following components:   Chloride 96 (*)    Glucose, Bld 139 (*)    Calcium 10.5 (*)    Anion gap 16 (*)    All other components within normal limits  TROPONIN I (HIGH SENSITIVITY)    CT HEAD WO CONTRAST ( )  Final Result      Medications  acetaminophen (TYLENOL) tablet 1,000 mg (1,000 mg Oral Given 04/06/23 0407)  hydrALAZINE (APRESOLINE) tablet 25 mg (25 mg Oral  Given 04/06/23 0407)  prochlorperazine (COMPAZINE) tablet 10 mg (10 mg Oral Given 04/06/23 0407)     Procedures  /  Critical Care Procedures  ED Course and Medical Decision Making  Initial Impression and Ddx Mild headache in the setting of high blood pressure.  Normal neurological exam, overall low concern for significant intracranial abnormal LAD.  Headache is overall improved currently, only 2 out of 10.  Shared decision making utilized, patient would like CT head to exclude obvious abnormality.  Chest pains are very atypical, obtaining troponin but favoring noncardiac.  Highly doubt PE.  Past medical/surgical history that increases complexity of ED encounter:  hypertension  Interpretation of Diagnostics I personally reviewed the EKG and my interpretation is as follows: Sinus rhythm without concerning ischemic features  Labs reassuring with no significant blood count or electrolyte disturbance.  Troponin negative.  CT head normal.  Patient Reassessment and Ultimate Disposition/Management     Patient is feeling much better with normal vital signs, headache resolved, appropriate for discharge.  Patient management required discussion with the following services or consulting groups:  None  Complexity of Problems Addressed Acute illness or injury that poses threat of life of bodily function  Additional Data Reviewed and Analyzed Further history obtained from: Further history from spouse/family member  Additional Factors Impacting ED Encounter Risk Prescriptions  Elmer Sow. Pilar Plate, MD [facility] Emergency Medicine [facility] [email or web address]  Final Clinical Impressions(s) / ED Diagnoses     ICD-10-CM   1. Hypertension, unspecified type  I10     2. Chest pain, unspecified type  R07.9       ED Discharge Orders          Ordered    hydrALAZINE (APRESOLINE) 25 MG tablet  3 times daily PRN,   Status:  Discontinued        04/06/23 0647    hydrALAZINE  (APRESOLINE) 25 MG tablet  3 times daily PRN        04/06/23 6045             Discharge Instructions Discussed with and Provided to Patient:     Discharge Instructions      You were evaluated in the Emergency Department and after careful evaluation, we did not find any emergent condition requiring admission or further testing in the hospital.  Your exam/testing today is overall reassuring.  Recommend close follow-up with your regular doctor to discuss your symptoms and your blood pressure management.  Continue the HCTZ medication at home.  Can use the hydralazine medication as needed for systolic blood pressure greater than 175.  Can do this up to 3 times daily.  Please return  to the Emergency Department if you experience any worsening of your condition.   Thank you for allowing [place] to be a part of your care.       Sabas Sous, MD 04/06/23 [phone]

## 2023-04-06 NOTE — Discharge Instructions (Addendum)
You were evaluated in the Emergency Department and after careful evaluation, we did not find any emergent condition requiring admission or further testing in the hospital.  Your exam/testing today is overall reassuring.  Recommend close follow-up with your regular doctor to discuss your symptoms and your blood pressure management.  Continue the HCTZ medication at home.  Can use the hydralazine medication as needed for systolic blood pressure greater than 175.  Can do this up to 3 times daily.  Please return to the Emergency Department if you experience any worsening of your condition.   Thank you for allowing [place] to be a part of your care.

## 2023-04-06 NOTE — ED Notes (Signed)
ED Provider at bedside. 

## 2023-05-16 DIAGNOSIS — M67871 Other specified disorders of synovium, right ankle and foot: Secondary | ICD-10-CM | POA: Diagnosis not present

## 2023-05-23 DIAGNOSIS — Z79899 Other long term (current) drug therapy: Secondary | ICD-10-CM | POA: Diagnosis not present

## 2023-05-23 DIAGNOSIS — E1140 Type 2 diabetes mellitus with diabetic neuropathy, unspecified: Secondary | ICD-10-CM | POA: Diagnosis not present

## 2023-05-23 DIAGNOSIS — R7989 Other specified abnormal findings of blood chemistry: Secondary | ICD-10-CM | POA: Diagnosis not present

## 2023-05-23 DIAGNOSIS — E782 Mixed hyperlipidemia: Secondary | ICD-10-CM | POA: Diagnosis not present

## 2023-05-23 DIAGNOSIS — E118 Type 2 diabetes mellitus with unspecified complications: Secondary | ICD-10-CM | POA: Diagnosis not present

## 2023-05-23 DIAGNOSIS — I10 Essential (primary) hypertension: Secondary | ICD-10-CM | POA: Diagnosis not present

## 2023-05-30 DIAGNOSIS — F419 Anxiety disorder, unspecified: Secondary | ICD-10-CM | POA: Diagnosis not present

## 2023-05-30 DIAGNOSIS — Z0000 Encounter for general adult medical examination without abnormal findings: Secondary | ICD-10-CM | POA: Diagnosis not present

## 2023-05-30 DIAGNOSIS — I10 Essential (primary) hypertension: Secondary | ICD-10-CM | POA: Diagnosis not present

## 2023-05-30 DIAGNOSIS — M1711 Unilateral primary osteoarthritis, right knee: Secondary | ICD-10-CM | POA: Diagnosis not present

## 2023-05-30 DIAGNOSIS — E782 Mixed hyperlipidemia: Secondary | ICD-10-CM | POA: Diagnosis not present

## 2023-05-30 DIAGNOSIS — E1140 Type 2 diabetes mellitus with diabetic neuropathy, unspecified: Secondary | ICD-10-CM | POA: Diagnosis not present

## 2023-05-30 DIAGNOSIS — E118 Type 2 diabetes mellitus with unspecified complications: Secondary | ICD-10-CM | POA: Diagnosis not present

## 2023-06-06 DIAGNOSIS — M25571 Pain in right ankle and joints of right foot: Secondary | ICD-10-CM | POA: Diagnosis not present

## 2023-06-16 DIAGNOSIS — Z1231 Encounter for screening mammogram for malignant neoplasm of breast: Secondary | ICD-10-CM | POA: Diagnosis not present

## 2023-09-24 DIAGNOSIS — E6609 Other obesity due to excess calories: Secondary | ICD-10-CM | POA: Diagnosis not present

## 2023-09-24 DIAGNOSIS — R143 Flatulence: Secondary | ICD-10-CM | POA: Diagnosis not present

## 2023-09-24 DIAGNOSIS — Z1211 Encounter for screening for malignant neoplasm of colon: Secondary | ICD-10-CM | POA: Diagnosis not present

## 2023-09-25 DIAGNOSIS — N898 Other specified noninflammatory disorders of vagina: Secondary | ICD-10-CM | POA: Diagnosis not present

## 2023-09-25 DIAGNOSIS — L900 Lichen sclerosus et atrophicus: Secondary | ICD-10-CM | POA: Diagnosis not present

## 2023-09-25 DIAGNOSIS — N952 Postmenopausal atrophic vaginitis: Secondary | ICD-10-CM | POA: Diagnosis not present

## 2023-10-23 DIAGNOSIS — Z1211 Encounter for screening for malignant neoplasm of colon: Secondary | ICD-10-CM | POA: Diagnosis not present

## 2023-11-24 DIAGNOSIS — I10 Essential (primary) hypertension: Secondary | ICD-10-CM | POA: Diagnosis not present

## 2023-11-24 DIAGNOSIS — E782 Mixed hyperlipidemia: Secondary | ICD-10-CM | POA: Diagnosis not present

## 2023-11-24 DIAGNOSIS — E118 Type 2 diabetes mellitus with unspecified complications: Secondary | ICD-10-CM | POA: Diagnosis not present

## 2023-12-03 DIAGNOSIS — E118 Type 2 diabetes mellitus with unspecified complications: Secondary | ICD-10-CM | POA: Diagnosis not present

## 2023-12-03 DIAGNOSIS — E782 Mixed hyperlipidemia: Secondary | ICD-10-CM | POA: Diagnosis not present

## 2023-12-18 DIAGNOSIS — N898 Other specified noninflammatory disorders of vagina: Secondary | ICD-10-CM | POA: Diagnosis not present

## 2023-12-18 DIAGNOSIS — L900 Lichen sclerosus et atrophicus: Secondary | ICD-10-CM | POA: Diagnosis not present

## 2023-12-18 DIAGNOSIS — A6004 Herpesviral vulvovaginitis: Secondary | ICD-10-CM | POA: Diagnosis not present

## 2024-02-18 DIAGNOSIS — N760 Acute vaginitis: Secondary | ICD-10-CM | POA: Diagnosis not present

## 2024-02-18 DIAGNOSIS — L900 Lichen sclerosus et atrophicus: Secondary | ICD-10-CM | POA: Diagnosis not present

## 2024-02-18 DIAGNOSIS — N952 Postmenopausal atrophic vaginitis: Secondary | ICD-10-CM | POA: Diagnosis not present

## 2024-03-01 DIAGNOSIS — N952 Postmenopausal atrophic vaginitis: Secondary | ICD-10-CM | POA: Diagnosis not present

## 2024-03-01 DIAGNOSIS — B3731 Acute candidiasis of vulva and vagina: Secondary | ICD-10-CM | POA: Diagnosis not present

## 2024-03-01 DIAGNOSIS — Z01411 Encounter for gynecological examination (general) (routine) with abnormal findings: Secondary | ICD-10-CM | POA: Diagnosis not present
